# Patient Record
Sex: Male | Born: 1980 | Race: Black or African American | Hispanic: No | Marital: Married | Smoking: Heavy tobacco smoker
Health system: Southern US, Community
[De-identification: ages and names within clinical notes are randomized; demographics above are authoritative.]

---

## 2016-05-28 ENCOUNTER — Emergency Department (HOSPITAL_COMMUNITY)
Admission: EM | Admit: 2016-05-28 | Discharge: 2016-05-28 | Disposition: A | Discharge status: Home / Self Care | Payer: Self-pay | Attending: Emergency Medicine | Admitting: Emergency Medicine

## 2016-05-28 ENCOUNTER — Encounter (HOSPITAL_COMMUNITY): Payer: Self-pay | Admitting: Emergency Medicine

## 2016-05-28 DIAGNOSIS — Y999 Unspecified external cause status: Secondary | ICD-10-CM | POA: Insufficient documentation

## 2016-05-28 DIAGNOSIS — F17210 Nicotine dependence, cigarettes, uncomplicated: Secondary | ICD-10-CM | POA: Insufficient documentation

## 2016-05-28 DIAGNOSIS — W260XXA Contact with knife, initial encounter: Secondary | ICD-10-CM | POA: Insufficient documentation

## 2016-05-28 DIAGNOSIS — Y93G3 Activity, cooking and baking: Secondary | ICD-10-CM | POA: Insufficient documentation

## 2016-05-28 DIAGNOSIS — S61215A Laceration without foreign body of left ring finger without damage to nail, initial encounter: Secondary | ICD-10-CM | POA: Insufficient documentation

## 2016-05-28 DIAGNOSIS — S61218A Laceration without foreign body of other finger without damage to nail, initial encounter: Secondary | ICD-10-CM

## 2016-05-28 DIAGNOSIS — Y929 Unspecified place or not applicable: Secondary | ICD-10-CM | POA: Insufficient documentation

## 2016-05-28 MED ORDER — LIDOCAINE HCL 2 % IJ SOLN
10.0000 mL | Freq: Once | INTRAMUSCULAR | Status: AC
  Administered 2016-05-28: 200 mg via INTRADERMAL
  Filled 2016-05-28: qty 20

## 2016-05-28 MED ORDER — TETANUS-DIPHTH-ACELL PERTUSSIS 5-2.5-18.5 LF-MCG/0.5 IM SUSP
0.5000 mL | Freq: Once | INTRAMUSCULAR | Status: AC
  Administered 2016-05-28: 0.5 mL via INTRAMUSCULAR
  Filled 2016-05-28: qty 0.5

## 2016-05-28 NOTE — Discharge Instructions (Signed)

## 2016-05-28 NOTE — ED Provider Notes (Signed)
CSN: [id number]     Arrival date & time 05/28/16  1649 History  By signing my name below, I, Soijett Blue, attest that this documentation has been prepared under the direction and in the presence of Bethel Born, PA-C Electronically Signed: Soijett Blue, ED Scribe. 05/28/2016. 7:57 PM.   Chief Complaint  Patient presents with  . Finger Injury    The history is provided by the patient. No language interpreter was used.    Wayne Cunningham is a 35 y.o. male who presents to the Emergency Department complaining of left ring finger injury occurring 4.5 hours ago. Pt notes that he cut his left ring finger with a kitchen knife while cooking. Pt is right hand dominant. Pt is not UTD with his tetanus vaccination. Pt is having associated symptoms of laceration to left ring finger. Pt states that the laceration has been oozing blood since occurring. He notes that he has tried applying pressure without medications for the relief of his symptoms. He denies fever, swelling, and any other symptoms.    History reviewed. No pertinent past medical history. History reviewed. No pertinent past surgical history. No family history on file. Social History  Substance Use Topics  . Smoking status: Current Every Day Smoker -- 0.50 packs/day    Types: Cigarettes  . Smokeless tobacco: None  . Alcohol Use: No    Review of Systems  Constitutional: Negative for fever.  Musculoskeletal: Positive for arthralgias (left ring). Negative for joint swelling.  Skin: Positive for wound (laceration to left ring). Negative for color change.     Allergies  Review of patient's allergies indicates no known allergies.  Home Medications   Prior to Admission medications   Not on File   BP 147/88 mmHg  Pulse 80  Temp(Src) 98 F (36.7 C) (Oral)  Resp 16  Ht  (1.702 m)  Wt 192 lb (87.091 kg)  BMI 30.06 kg/m2  SpO2 99%   Physical Exam  Constitutional: He is oriented to person, place, and time. He appears  well-developed and well-nourished. No distress.  HENT:  Head: Normocephalic and atraumatic.  Eyes: EOM are normal.  Neck: Neck supple.  Cardiovascular: Normal rate.   Pulmonary/Chest: Effort normal. No respiratory distress.  Abdominal: He exhibits no distension.  Musculoskeletal: Normal range of motion.       Left hand: He exhibits laceration. He exhibits normal range of motion. Normal sensation noted.  Left ring finger lateral and palmar aspect at the tip there is a 2 cm v-shaped laceration. No active bleeding. NVI. Sensation intact. Full flexion and extension.  Neurological: He is alert and oriented to person, place, and time.  Skin: Skin is warm and dry.  Psychiatric: He has a normal mood and affect. His behavior is normal.  Nursing note and vitals reviewed.   ED Course  Procedures (including critical care time) DIAGNOSTIC STUDIES: Oxygen Saturation is 99% on RA, nl by my interpretation.    COORDINATION OF CARE: 7:28 PT Discussed treatment plan with pt at bedside which includes wound care, update tetanus, laceration repair, follow up for suture removal in 1 week and pt agreed to plan.   LACERATION REPAIR PROCEDURE NOTE The patient's identification was confirmed and consent was obtained. This procedure was performed by Ivory Broad. Jefm Bryant, PA-C at 8:11 PM. Site: Left ring finger lateral and palmar aspect at the tip  Sterile procedures observed: YES Anesthetic used (type and amt): 2 % Lidocaine without Epinephrine and 3 cc used Suture type/size:4-0 Ethilon  Length:  2 cm # of Sutures: 3 Technique:Simple interrupted Complexity: SImple Antibx ointment applied: bacitracin Tetanus UTD or ordered: updated while in ED Site anesthetized, irrigated with NS, explored without evidence of foreign body, wound well approximated, site covered with dry, sterile dressing.  Patient tolerated procedure well without complications. Instructions for care discussed verbally and patient provided  with additional written instructions for homecare and f/u.  Meds given in ED:  Medications  lidocaine (XYLOCAINE) 2 % (with pres) injection 200 mg (200 mg Intradermal Given 05/28/16 2003)  Tdap (BOOSTRIX) injection 0.5 mL (0.5 mLs Intramuscular Given 05/28/16 2004)    There are no discharge medications for this patient.    MDM   Final diagnoses:  Laceration of finger, ring, initial encounter   Patient presents with laceration to left ring finger which was ~2cm in length after cutting himself with a knife. Repaired/irrigated/cleaned in ED. Bottom of the wound visualized and bleeding controlled. 3 4-0 Ethilon sutures placed. Plan is to DC home. Advised Ibuprofen for pain and follow up with PCP of UC in 7-10 days for suture removal. Given strict return precautions. At time of discharge, Patient is in no acute distress. Vital Signs are stable. Patient is able to ambulate. Patient able to tolerate PO.   I personally performed the services described in this documentation, which was scribed in my presence. The recorded information has been reviewed and is accurate.    Bethel BornKelly Marie Gekas, PA-C 05/29/16 1623  Marily MemosJason Mesner, MD 05/31/16 [phone]

## 2016-05-28 NOTE — ED Notes (Signed)
Pt cut left ring finger with a kitchen knife. Laceration to outside of finger. Bleeding controlled with sterile gauze.

## 2016-07-29 ENCOUNTER — Encounter (HOSPITAL_COMMUNITY): Payer: Self-pay | Admitting: Emergency Medicine

## 2016-07-29 DIAGNOSIS — B353 Tinea pedis: Secondary | ICD-10-CM | POA: Insufficient documentation

## 2016-07-29 DIAGNOSIS — F17210 Nicotine dependence, cigarettes, uncomplicated: Secondary | ICD-10-CM | POA: Insufficient documentation

## 2016-07-29 NOTE — ED Triage Notes (Signed)
Pt here with swelling to right foot x 1 week. Pt reports unable to bear weight. No known injury. Pt sts he thinks his toe is infected. Pt denies fevers.

## 2016-07-30 ENCOUNTER — Emergency Department (HOSPITAL_COMMUNITY)
Admission: EM | Admit: 2016-07-30 | Discharge: 2016-07-30 | Disposition: A | Discharge status: Home / Self Care | Payer: Self-pay | Attending: Emergency Medicine | Admitting: Emergency Medicine

## 2016-07-30 ENCOUNTER — Emergency Department (HOSPITAL_COMMUNITY): Payer: Self-pay

## 2016-07-30 DIAGNOSIS — L03115 Cellulitis of right lower limb: Secondary | ICD-10-CM

## 2016-07-30 DIAGNOSIS — B353 Tinea pedis: Secondary | ICD-10-CM

## 2016-07-30 MED ORDER — CEPHALEXIN 500 MG PO CAPS: 500.0000 mg | ORAL_CAPSULE | Freq: Four times a day (QID) | ORAL | 0 refills | Status: AC

## 2016-07-30 MED ORDER — HYDROCODONE-ACETAMINOPHEN 5-325 MG PO TABS
2.0000 | ORAL_TABLET | Freq: Once | ORAL | Status: AC
  Administered 2016-07-30: 2 via ORAL
  Filled 2016-07-30: qty 2

## 2016-07-30 MED ORDER — NYSTATIN 100000 UNIT/GM EX POWD
Freq: Once | CUTANEOUS | Status: AC
  Administered 2016-07-30: 03:00:00 via TOPICAL
  Filled 2016-07-30: qty 15

## 2016-07-30 MED ORDER — CLOTRIMAZOLE 1 % EX CREA: 1.0000 "application " | TOPICAL_CREAM | Freq: Two times a day (BID) | CUTANEOUS | 0 refills | Status: AC

## 2016-07-30 MED ORDER — IBUPROFEN 600 MG PO TABS
600.0000 mg | ORAL_TABLET | Freq: Four times a day (QID) | ORAL | 0 refills | Status: DC | PRN
Start: 2016-07-30 — End: 2017-04-17

## 2016-07-30 NOTE — Discharge Instructions (Signed)
We saw you in the ER for your WOUND. Please read the instructions provided on wound care. Keep the area clean and dry, apply antifungal ointment daily and take the medications provided. RETURN TO THE ER IF THERE IS INCREASED PAIN, REDNESS, PUS COMING OUT from the wound site immediately. Otherwise consider returning to urgent care or the ER for wound recheck in 1 week.

## 2016-07-30 NOTE — ED Provider Notes (Signed)
[facility]-EMERGENCY DEPT Provider Note   CSN: [id number] Arrival date & time: 07/29/16  2009   By signing my name below, I, Wayne Cunningham, attest that this documentation has been prepared under the direction and in the presence of Derwood KaplanAnkit Trae Bovenzi, MD.  Electronically Signed: Octavia HeirArianna Cunningham, ED Scribe. 07/30/16. 2:05 AM.   History   Chief Complaint Chief Complaint  Patient presents with  . Foot Swelling   The history is provided by the patient. No language interpreter was used.   HPI Comments: Wayne Cunningham is a 35 y.o. male who presents to the Emergency Department complaining of sudden onset, gradual worsening, moderate right foot swelling x 6 days ago. The pain radiates up into his right leg. He notes waking up last Wednesday with sudden pain and swelling in his right foot. He denies any injury but states he was at the beach before his symptoms started. Pt has not had these symptoms before in the past. Pt states he has been soaking his foot in epsom salts with no relief. He denies nausea, vomiting, fever, or chills. Pt further denies hx of athletes foot or any fungal infections.  History reviewed. No pertinent past medical history.  There are no active problems to display for this patient.   History reviewed. No pertinent surgical history.     Home Medications    Prior to Admission medications   Medication Sig Start Date End Date Taking? Authorizing Provider  cephALEXin (KEFLEX) 500 MG capsule Take 1 capsule (500 mg total) by mouth 4 (four) times daily. 07/30/16   Derwood KaplanAnkit Haelyn Forgey, MD  clotrimazole (LOTRIMIN) 1 % cream Apply 1 application topically 2 (two) times daily. 07/30/16   Derwood KaplanAnkit Kasra Melvin, MD  ibuprofen (ADVIL,MOTRIN) 600 MG tablet Take 1 tablet (600 mg total) by mouth every 6 (six) hours as needed. 07/30/16   Derwood KaplanAnkit Yoltzin Ransom, MD    Family History History reviewed. No pertinent family history.  Social History Social History  Substance Use Topics  . Smoking status: Heavy  Tobacco Smoker    Packs/day: 1.00    Types: Cigarettes  . Smokeless tobacco: Never Used  . Alcohol use Yes     Comment: occasional     Allergies   Review of patient's allergies indicates no known allergies.   Review of Systems Review of Systems A complete 10 system review of systems was obtained and all systems are negative except as noted in the HPI and PMH.    Physical Exam Updated Vital Signs BP 129/65   Pulse 66   Temp 98.3 F (36.8 C)   Resp 18   Ht 5\' 7"  (1.702 m)   Wt 197 lb (89.4 kg)   SpO2 100%   BMI 30.85 kg/m   Physical Exam  Constitutional: He is oriented to person, place, and time. He appears well-developed and well-nourished.  HENT:  Head: Normocephalic.  Eyes: EOM are normal.  Neck: Normal range of motion.  Pulmonary/Chest: Effort normal.  Abdominal: He exhibits no distension.  Musculoskeletal: Normal range of motion. He exhibits edema and tenderness.  2+ DP pulse of right foot Right foot is grossly edematous with some erythema over toe number 4 and 5. Pt particularly has significant swelling over the 4th toe proximally and has some loss of integrity of the epidermis. No active purulent drainage or bleeding. In the left space between toe 4 and 5, the skin appears irritated/hypertrophied and likely loss of integrity as well. No significant warmth to touch.  Neurological: He is alert and oriented to person,  place, and time.  Skin: There is erythema.  Psychiatric: He has a normal mood and affect.  Nursing note and vitals reviewed.    ED Treatments / Results  DIAGNOSTIC STUDIES: Oxygen Saturation is 100% on RA, normal by my interpretation.  COORDINATION OF CARE:  2:04 AM Discussed treatment plan with pt at bedside and pt agreed to plan.  Labs (all labs ordered are listed, but only abnormal results are displayed) Labs Reviewed - No data to display  EKG  EKG Interpretation None       Radiology Dg Foot Complete Right  Result Date:  07/30/2016 CLINICAL DATA:  Wound between fourth and fifth toes. EXAM: RIGHT FOOT COMPLETE - 3+ VIEW COMPARISON:  None. FINDINGS: No soft tissue gas is identified. There is no evidence of fracture or dislocation. No osteolysis or focal erosion. There is no evidence of arthropathy or other focal bone abnormality. Soft tissues are unremarkable. IMPRESSION: No soft tissue gas, osteolysis or other evidence of acute osteitis. Electronically Signed   By: Deatra Robinson M.D.   On: 07/30/2016 02:40    Procedures Procedures (including critical care time)  Medications Ordered in ED Medications  HYDROcodone-acetaminophen (NORCO/VICODIN) 5-325 MG per tablet 2 tablet (2 tablets Oral Given 07/30/16 0216)  nystatin (MYCOSTATIN/NYSTOP) topical powder ( Topical Given 07/30/16 0243)     Initial Impression / Assessment and Plan / ED Course  I have reviewed the triage vital signs and the nursing notes.  Pertinent labs & imaging results that were available during my care of the patient were reviewed by me and considered in my medical decision making (see chart for details).  Clinical Course   I personally performed the services described in this documentation, which was scribed in my presence. The recorded information has been reviewed and is accurate.  Pt comes in with cc of foot pain and swelling. He is an immunocompetent man with no preceding trauma. VSS and WNL. Xrays dont show any gas. ? Fungal infection in the web space between toes. We will start him on keflex. Antifungal prescribed. Strict ER return precautions have been discussed, and patient is agreeing with the plan and is comfortable with the workup done and the recommendations from the ER.   Final Clinical Impressions(s) / ED Diagnoses   Final diagnoses:  Cellulitis of right lower extremity  Tinea pedis of right foot    New Prescriptions Discharge Medication List as of 07/30/2016  3:39 AM    START taking these medications   Details   cephALEXin (KEFLEX) 500 MG capsule Take 1 capsule (500 mg total) by mouth 4 (four) times daily., Starting Tue 07/30/2016, Print    clotrimazole (LOTRIMIN) 1 % cream Apply 1 application topically 2 (two) times daily., Starting Tue 07/30/2016, Print    ibuprofen (ADVIL,MOTRIN) 600 MG tablet Take 1 tablet (600 mg total) by mouth every 6 (six) hours as needed., Starting Tue 07/30/2016, Print         Derwood Kaplan, MD 07/31/16 [phone]

## 2017-04-17 ENCOUNTER — Encounter (HOSPITAL_COMMUNITY): Payer: Self-pay | Admitting: Emergency Medicine

## 2017-04-17 ENCOUNTER — Emergency Department (HOSPITAL_COMMUNITY): Payer: No Typology Code available for payment source

## 2017-04-17 ENCOUNTER — Emergency Department (HOSPITAL_COMMUNITY)
Admission: EM | Admit: 2017-04-17 | Discharge: 2017-04-17 | Disposition: A | Discharge status: Home / Self Care | Payer: No Typology Code available for payment source | Attending: Emergency Medicine | Admitting: Emergency Medicine

## 2017-04-17 DIAGNOSIS — Y999 Unspecified external cause status: Secondary | ICD-10-CM | POA: Insufficient documentation

## 2017-04-17 DIAGNOSIS — S4991XA Unspecified injury of right shoulder and upper arm, initial encounter: Secondary | ICD-10-CM | POA: Diagnosis present

## 2017-04-17 DIAGNOSIS — F17210 Nicotine dependence, cigarettes, uncomplicated: Secondary | ICD-10-CM | POA: Diagnosis not present

## 2017-04-17 DIAGNOSIS — Z79899 Other long term (current) drug therapy: Secondary | ICD-10-CM | POA: Diagnosis not present

## 2017-04-17 DIAGNOSIS — M25511 Pain in right shoulder: Secondary | ICD-10-CM

## 2017-04-17 DIAGNOSIS — Y92410 Unspecified street and highway as the place of occurrence of the external cause: Secondary | ICD-10-CM | POA: Diagnosis not present

## 2017-04-17 DIAGNOSIS — Y939 Activity, unspecified: Secondary | ICD-10-CM | POA: Insufficient documentation

## 2017-04-17 MED ORDER — IBUPROFEN 800 MG PO TABS
800.0000 mg | ORAL_TABLET | Freq: Once | ORAL | Status: AC
  Administered 2017-04-17: 800 mg via ORAL
  Filled 2017-04-17: qty 1

## 2017-04-17 MED ORDER — METHOCARBAMOL 500 MG PO TABS
500.0000 mg | ORAL_TABLET | Freq: Two times a day (BID) | ORAL | 0 refills | Status: AC | PRN
Start: 2017-04-17 — End: ?

## 2017-04-17 MED ORDER — METHOCARBAMOL 500 MG PO TABS
500.0000 mg | ORAL_TABLET | Freq: Once | ORAL | Status: AC
  Administered 2017-04-17: 500 mg via ORAL
  Filled 2017-04-17: qty 1

## 2017-04-17 MED ORDER — IBUPROFEN 800 MG PO TABS: 800.0000 mg | ORAL_TABLET | Freq: Three times a day (TID) | ORAL | 0 refills | Status: AC | PRN
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
## 2017-04-17 NOTE — Discharge Instructions (Signed)
It was my pleasure taking care of you today!  Ibuprofen as needed for pain. Robaxin is your muscle relaxer to take as needed for muscle tightness or spasms. Ice shoulder throughout the day (instructions below).   Call the orthopedist listed if symptoms are not improved in one week.   Return to the ER for new or worsening symptoms, any additional concerns.  COLD THERAPY DIRECTIONS:  Ice or gel packs can be used to reduce both pain and swelling. Ice is the most helpful within the first 24 to 48 hours after an injury or flareup from overusing a muscle or joint.  Ice is effective, has very few side effects, and is safe for most people to use.   If you expose your skin to cold temperatures for too long or without the proper protection, you can damage your skin or nerves. Watch for signs of skin damage due to cold.   HOME CARE INSTRUCTIONS  Follow these tips to use ice and cold packs safely.  Place a dry or damp towel between the ice and skin. A damp towel will cool the skin more quickly, so you may need to shorten the time that the ice is used.  For a more rapid response, add gentle compression to the ice.  Ice for no more than 10 to 20 minutes at a time. The bonier the area you are icing, the less time it will take to get the benefits of ice.  Check your skin after 5 minutes to make sure there are no signs of a poor response to cold or skin damage.  Rest 20 minutes or more in between uses.  Once your skin is numb, you can end your treatment. You can test numbness by very lightly touching your skin. The touch should be so light that you do not see the skin dimple from the pressure of your fingertip. When using ice, most people will feel these normal sensations in this order: cold, burning, aching, and numbness.
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
